# Patient Record
Sex: Female | Born: 1935 | Race: White | Hispanic: No
Health system: Southern US, Community
[De-identification: ages and names within clinical notes are randomized; demographics above are authoritative.]

## PROBLEM LIST (undated history)

## (undated) DIAGNOSIS — I10 Essential (primary) hypertension: Secondary | ICD-10-CM

---

## 2017-08-12 ENCOUNTER — Other Ambulatory Visit: Payer: Self-pay | Admitting: Family Medicine

## 2017-08-12 DIAGNOSIS — R911 Solitary pulmonary nodule: Secondary | ICD-10-CM

## 2017-09-04 ENCOUNTER — Ambulatory Visit
Admission: RE | Admit: 2017-09-04 | Discharge: 2017-09-04 | Disposition: A | Discharge status: Home / Self Care | Payer: Medicare Other | Source: Ambulatory Visit | Attending: Family Medicine | Admitting: Family Medicine

## 2017-09-04 DIAGNOSIS — K7689 Other specified diseases of liver: Secondary | ICD-10-CM | POA: Insufficient documentation

## 2017-09-04 DIAGNOSIS — R911 Solitary pulmonary nodule: Secondary | ICD-10-CM | POA: Diagnosis present

## 2017-09-04 DIAGNOSIS — J984 Other disorders of lung: Secondary | ICD-10-CM | POA: Diagnosis not present

## 2017-09-04 DIAGNOSIS — D71 Functional disorders of polymorphonuclear neutrophils: Secondary | ICD-10-CM | POA: Insufficient documentation

## 2017-09-04 DIAGNOSIS — I2510 Atherosclerotic heart disease of native coronary artery without angina pectoris: Secondary | ICD-10-CM | POA: Insufficient documentation

## 2017-09-04 DIAGNOSIS — Z0000 Encounter for general adult medical examination without abnormal findings: Secondary | ICD-10-CM | POA: Diagnosis not present

## 2017-09-04 DIAGNOSIS — I700 Atherosclerosis of aorta: Secondary | ICD-10-CM | POA: Insufficient documentation

## 2017-10-14 ENCOUNTER — Other Ambulatory Visit: Payer: Self-pay | Admitting: Gastroenterology

## 2017-10-14 DIAGNOSIS — K7689 Other specified diseases of liver: Secondary | ICD-10-CM

## 2017-10-22 ENCOUNTER — Ambulatory Visit
Admission: RE | Admit: 2017-10-22 | Discharge: 2017-10-22 | Disposition: A | Discharge status: Home / Self Care | Payer: Medicare Other | Source: Ambulatory Visit | Attending: Gastroenterology | Admitting: Gastroenterology

## 2017-10-22 DIAGNOSIS — N83201 Unspecified ovarian cyst, right side: Secondary | ICD-10-CM | POA: Diagnosis not present

## 2017-10-22 DIAGNOSIS — I700 Atherosclerosis of aorta: Secondary | ICD-10-CM | POA: Diagnosis not present

## 2017-10-22 DIAGNOSIS — D71 Functional disorders of polymorphonuclear neutrophils: Secondary | ICD-10-CM | POA: Insufficient documentation

## 2017-10-22 DIAGNOSIS — K7689 Other specified diseases of liver: Secondary | ICD-10-CM | POA: Insufficient documentation

## 2017-10-22 DIAGNOSIS — N281 Cyst of kidney, acquired: Secondary | ICD-10-CM | POA: Insufficient documentation

## 2017-10-22 DIAGNOSIS — I2510 Atherosclerotic heart disease of native coronary artery without angina pectoris: Secondary | ICD-10-CM | POA: Diagnosis not present

## 2017-10-22 DIAGNOSIS — N83202 Unspecified ovarian cyst, left side: Secondary | ICD-10-CM | POA: Insufficient documentation

## 2017-10-22 HISTORY — DX: Essential (primary) hypertension: I10

## 2017-10-22 LAB — POCT I-STAT CREATININE: Creatinine, Ser: 1.1 mg/dL — ABNORMAL HIGH (ref 0.44–1.00)

## 2017-10-22 MED ORDER — IOPAMIDOL (ISOVUE-300) INJECTION 61%
80.0000 mL | Freq: Once | INTRAVENOUS | Status: AC | PRN
  Administered 2017-10-22: 80 mL via INTRAVENOUS

## 2018-11-29 IMAGING — CT CT CHEST W/O CM
2 of 4 series · 15 of 36 positions shown, 18 images · non-contrast
Comparison: None.

CLINICAL DATA: Right lower lung nodule

EXAM:
CT CHEST WITHOUT CONTRAST
TECHNIQUE: Multidetector CT imaging of the chest was performed following the
standard protocol without IV contrast.

[Series 2: chest · axial · 0.62mm/px · z∈[-1226,-974]mm · 12 of 150 slices shown, 15 images (1 of 2)]
[im 12/150  mediastinal]
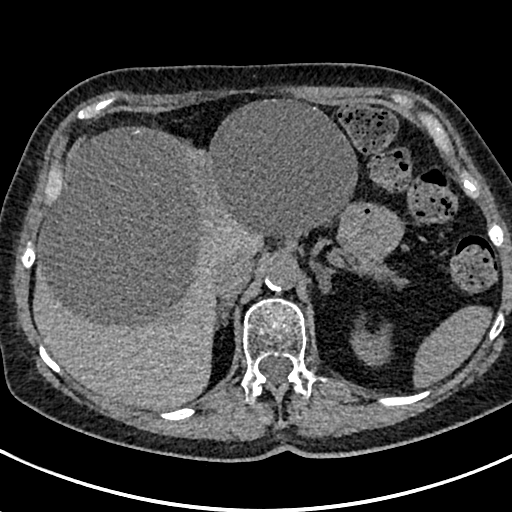
[im 12/150  lung]
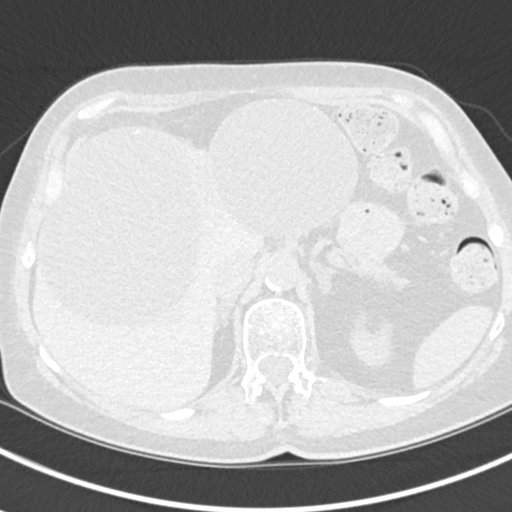
[im 23/150  lung]
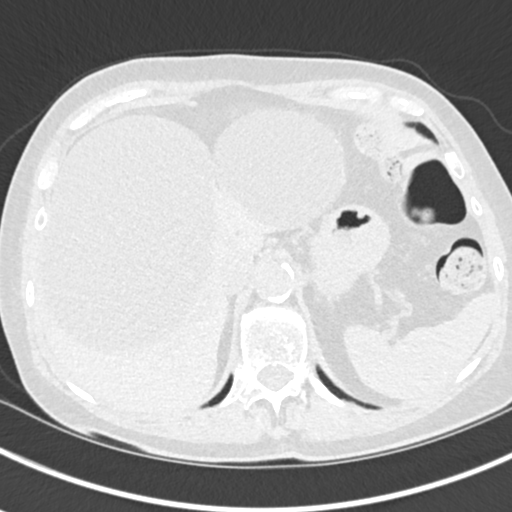
[im 35/150  lung]
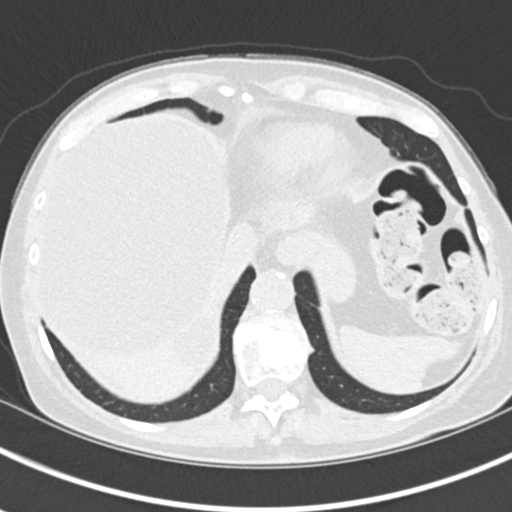
[im 46/150  lung]
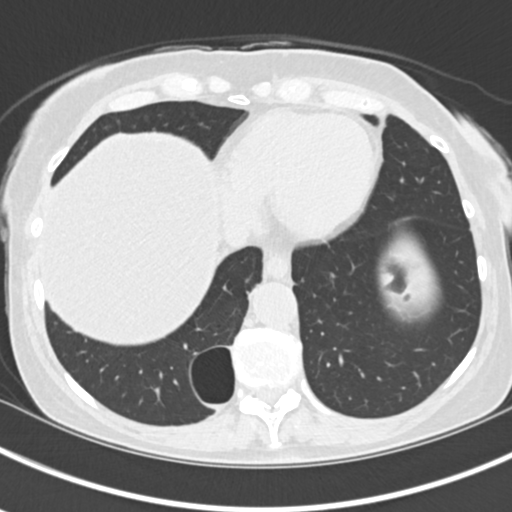
[im 58/150  mediastinal]
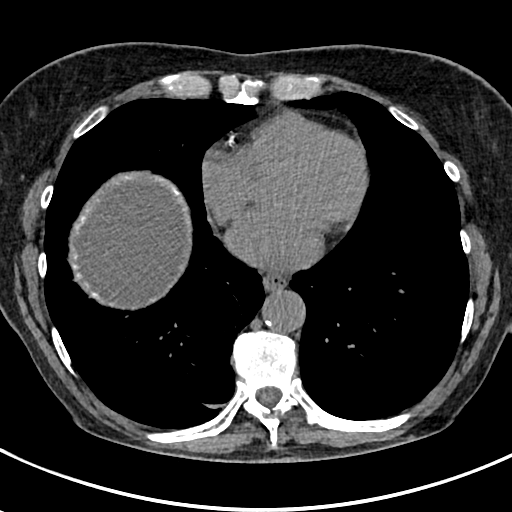
[im 58/150  lung]
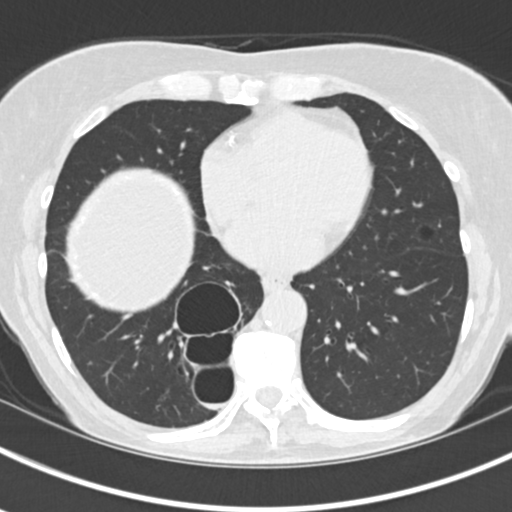
[im 69/150  lung]
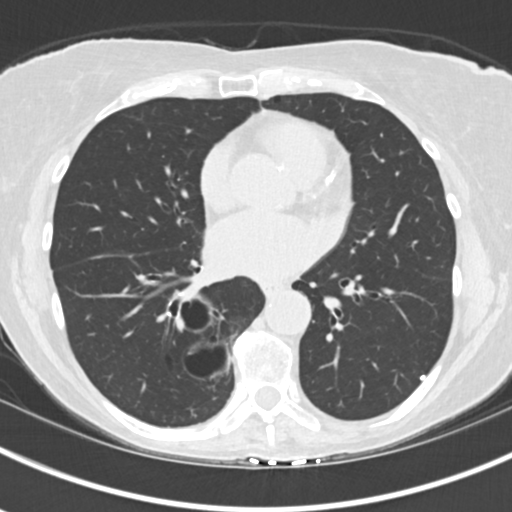
[im 81/150  lung]
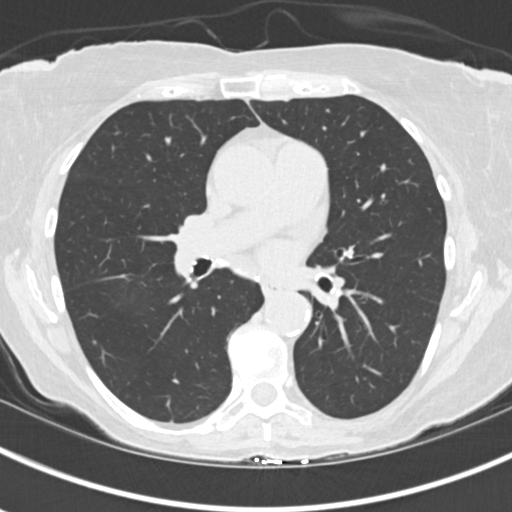
[im 92/150  lung]
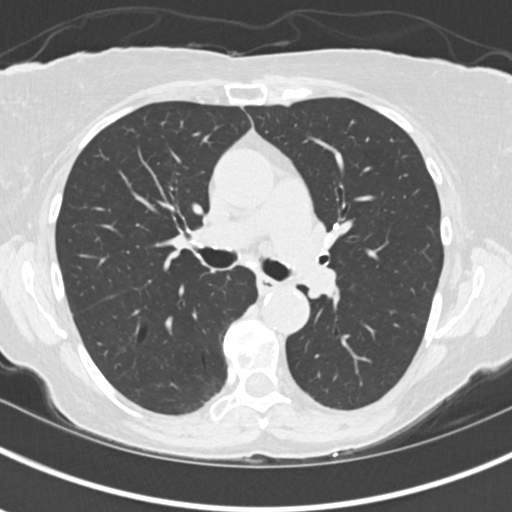
[im 104/150  mediastinal]
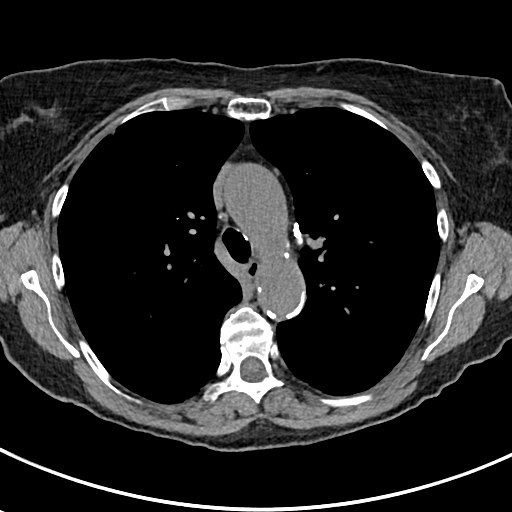
[im 104/150  lung]
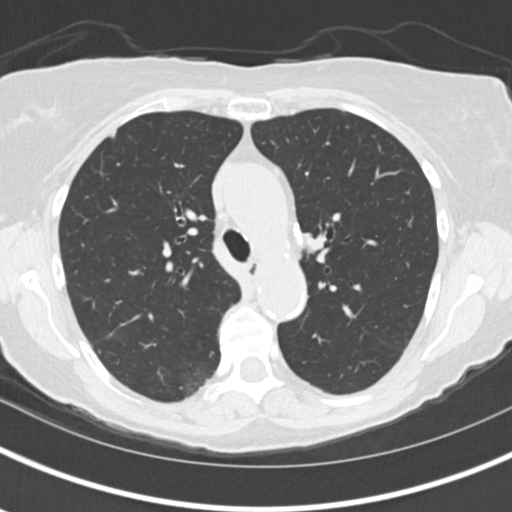
[im 115/150  lung]
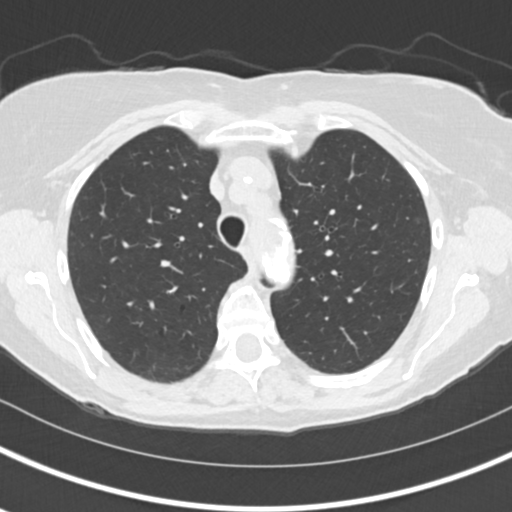
[im 127/150  lung]
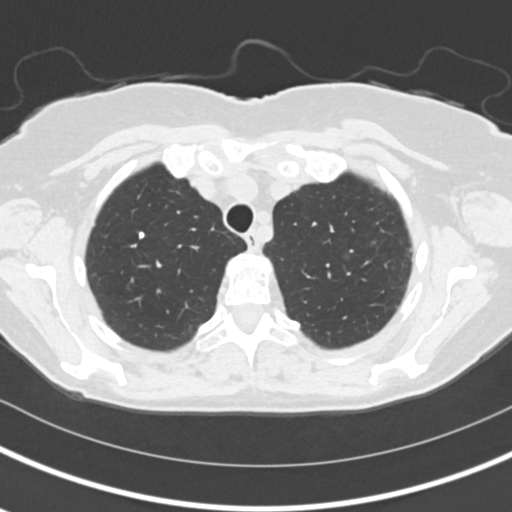
[im 138/150  lung]
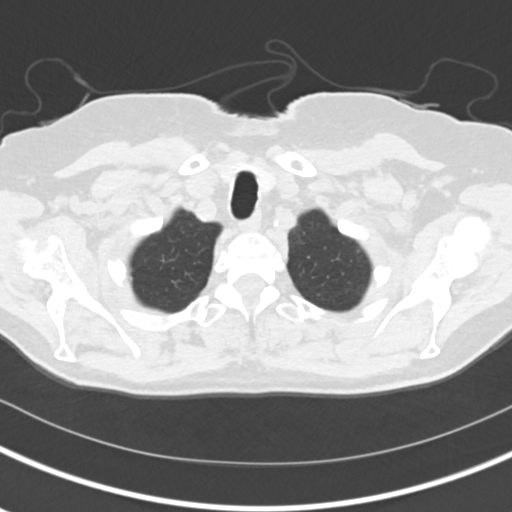

[Series 5: chest · coronal · 0.59mm/px · 3 of 154 slices shown (2 of 2)]
[im 31/154  lung]
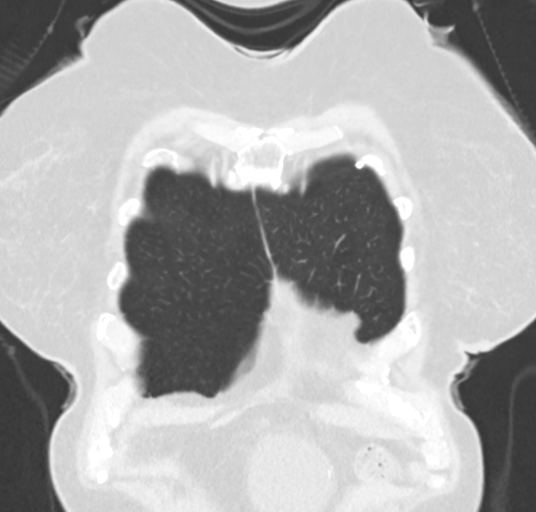
[im 62/154  lung]
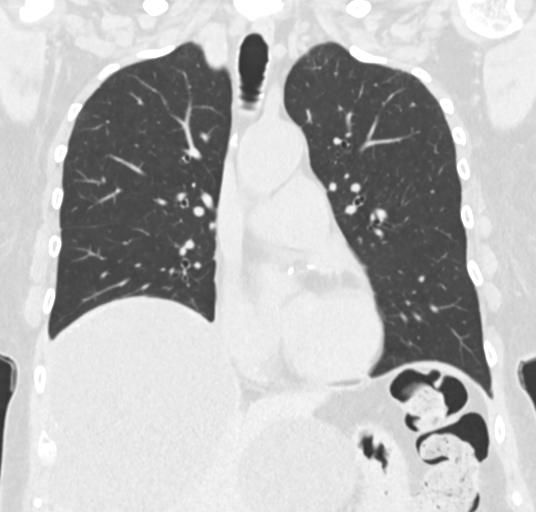
[im 92/154  lung]
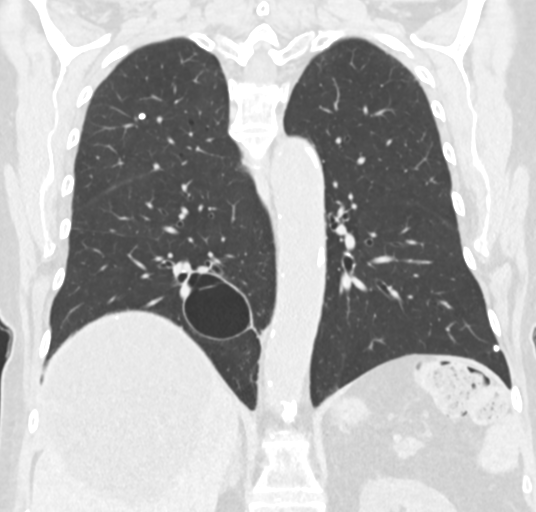

[15 of 36 positions shown; findings below may reference images not displayed]

FINDINGS: Cardiovascular: Heart is normal size. Diffuse coronary artery and
moderate aortic calcifications. No evidence of aortic aneurysm.

Mediastinum/Nodes: Extensive calcified mediastinal and bilateral
hilar lymph nodes. No mediastinal, hilar, or axillary adenopathy.

Lungs/Pleura: Numerous bilateral calcified pulmonary nodules
compatible with old granulomas. No noncalcified pulmonary nodules.
Air-filled cysts are noted in the right lower lobe. Small air-fluid
level is noted in the posterior-most air-filled cyst. This cyst
measures 3.9 cm in greatest diameter. These could reflect blebs or
pneumatoceles. Small air-filled cyst noted in the posterior lingula
as well. No pleural effusions.

Upper Abdomen: Large cyst noted replacing much of the right hepatic
lobe measuring 14.8 x 11 cm with peripheral rim calcifications.
Large cyst in the left hepatic lobe measures 8.5 x 8.5 cm.

Musculoskeletal: Chest wall soft tissues are unremarkable. No acute
bony abnormality.
IMPRESSION: Evidence of old granulomatous disease with multiple bilateral
calcified nodules and bilateral hilar and mediastinal lymph nodes.

Severe coronary artery disease, aortic atherosclerosis.

Air-filled cysts in the right lower lobe and lingula. One of the
air-filled cyst in the right lower lobe has a short air-fluid level
and could reflect superinfection.

Large rim calcified cysts in the liver, partially imaged.

## 2020-02-03 ENCOUNTER — Other Ambulatory Visit: Payer: Self-pay | Admitting: Infectious Diseases

## 2020-02-03 ENCOUNTER — Telehealth: Payer: Self-pay | Admitting: Infectious Diseases

## 2020-02-03 ENCOUNTER — Ambulatory Visit (HOSPITAL_COMMUNITY)
Admission: RE | Admit: 2020-02-03 | Discharge: 2020-02-03 | Disposition: A | Discharge status: Home / Self Care | Payer: Medicare Other | Source: Ambulatory Visit | Attending: Pulmonary Disease | Admitting: Pulmonary Disease

## 2020-02-03 DIAGNOSIS — U071 COVID-19: Secondary | ICD-10-CM

## 2020-02-03 DIAGNOSIS — Z23 Encounter for immunization: Secondary | ICD-10-CM | POA: Insufficient documentation

## 2020-02-03 DIAGNOSIS — R54 Age-related physical debility: Secondary | ICD-10-CM | POA: Diagnosis present

## 2020-02-03 MED ORDER — ALBUTEROL SULFATE HFA 108 (90 BASE) MCG/ACT IN AERS: 2.0000 | INHALATION_SPRAY | Freq: Once | RESPIRATORY_TRACT | Status: DC | PRN

## 2020-02-03 MED ORDER — SODIUM CHLORIDE 0.9 % IV SOLN: INTRAVENOUS | Status: DC | PRN

## 2020-02-03 MED ORDER — DIPHENHYDRAMINE HCL 50 MG/ML IJ SOLN: 50.0000 mg | Freq: Once | INTRAMUSCULAR | Status: DC | PRN

## 2020-02-03 MED ORDER — SODIUM CHLORIDE 0.9 % IV SOLN
1200.0000 mg | Freq: Once | INTRAVENOUS | Status: AC
  Administered 2020-02-03: 1200 mg via INTRAVENOUS

## 2020-02-03 MED ORDER — METHYLPREDNISOLONE SODIUM SUCC 125 MG IJ SOLR: 125.0000 mg | Freq: Once | INTRAMUSCULAR | Status: DC | PRN

## 2020-02-03 MED ORDER — EPINEPHRINE 0.3 MG/0.3ML IJ SOAJ: 0.3000 mg | Freq: Once | INTRAMUSCULAR | Status: DC | PRN

## 2020-02-03 MED ORDER — FAMOTIDINE IN NACL 20-0.9 MG/50ML-% IV SOLN: 20.0000 mg | Freq: Once | INTRAVENOUS | Status: DC | PRN

## 2020-02-03 NOTE — Progress Notes (Signed)
I connected by phone with Tiffany Conway on 02/03/2020 at 1:51 PM to discuss the potential use of a new treatment for mild to moderate COVID-19 viral infection in non-hospitalized patients.  This patient is a 86 y.o. female that meets the FDA criteria for Emergency Use Authorization of COVID monoclonal antibody casirivimab/imdevimab or bamlanivimab/eteseviamb.  Has a (+) direct SARS-CoV-2 viral test result  Has mild or moderate COVID-19   Is NOT hospitalized due to COVID-19  Is within 10 days of symptom onset  Has at least one of the high risk factor(s) for progression to severe COVID-19 and/or hospitalization as defined in EUA.  Specific high risk criteria : Older age (>/= 84 yo)   I have spoken and communicated the following to the patient or parent/caregiver regarding COVID monoclonal antibody treatment:  1. FDA has authorized the emergency use for the treatment of mild to moderate COVID-19 in adults and pediatric patients with positive results of direct SARS-CoV-2 viral testing who are 87 years of age and older weighing at least 40 kg, and who are at high risk for progressing to severe COVID-19 and/or hospitalization.  2. The significant known and potential risks and benefits of COVID monoclonal antibody, and the extent to which such potential risks and benefits are unknown.  3. Information on available alternative treatments and the risks and benefits of those alternatives, including clinical trials.  4. Patients treated with COVID monoclonal antibody should continue to self-isolate and use infection control measures (e.g., wear mask, isolate, social distance, avoid sharing personal items, clean and disinfect "high touch" surfaces, and frequent handwashing) according to CDC guidelines.   5. The patient or parent/caregiver has the option to accept or refuse COVID monoclonal antibody treatment.  After reviewing this information with the patient, the patient has agreed to receive one of  the available covid 19 monoclonal antibodies and will be provided an appropriate fact sheet prior to infusion. [name], NP 02/03/2020 1:51 PM

## 2020-02-03 NOTE — Telephone Encounter (Signed)
Called to Discuss with patient about Covid symptoms and the use of the monoclonal antibody infusion for those with mild to moderate Covid symptoms and at a high risk of hospitalization.     Pt appears to qualify for this infusion due to co-morbid conditions and/or a member of an at-risk group in accordance with the FDA Emergency Use Authorization.    She started with symptoms Sunday of this week.  She will bring her positive test with her.     [name], MSN, NP-C [facility]  [email or web address] Pager: [phone] Office: [phone] [facility] Main Line: [phone]

## 2020-02-03 NOTE — Discharge Instructions (Signed)

## 2020-02-03 NOTE — Progress Notes (Signed)
  Diagnosis: COVID-19  Physician: Dr  Wright  Procedure: [facility] Infusion [facility] Med: casirivimab\imdevimab infusion - Provided patient with casirivimab\imdevimab fact sheet for patients, parents and caregivers prior to infusion.  Complications: No immediate complications noted.  Discharge: Discharged home   Tiffany Conway 02/03/2020
